# Patient Record
Sex: Female | Born: 1955 | Race: White | Hispanic: No | Marital: Married
Health system: Southern US, Community
[De-identification: ages and names within clinical notes are randomized; demographics above are authoritative.]

## PROBLEM LIST (undated history)

## (undated) DIAGNOSIS — G2581 Restless legs syndrome: Secondary | ICD-10-CM

## (undated) DIAGNOSIS — C801 Malignant (primary) neoplasm, unspecified: Secondary | ICD-10-CM

## (undated) DIAGNOSIS — F329 Major depressive disorder, single episode, unspecified: Secondary | ICD-10-CM

## (undated) DIAGNOSIS — Z9221 Personal history of antineoplastic chemotherapy: Secondary | ICD-10-CM

## (undated) DIAGNOSIS — Z923 Personal history of irradiation: Secondary | ICD-10-CM

## (undated) DIAGNOSIS — C8800 Waldenstrom macroglobulinemia not having achieved remission: Secondary | ICD-10-CM

## (undated) DIAGNOSIS — F32A Depression, unspecified: Secondary | ICD-10-CM

## (undated) DIAGNOSIS — E039 Hypothyroidism, unspecified: Secondary | ICD-10-CM

## (undated) DIAGNOSIS — E785 Hyperlipidemia, unspecified: Secondary | ICD-10-CM

## (undated) DIAGNOSIS — C73 Malignant neoplasm of thyroid gland: Secondary | ICD-10-CM

## (undated) HISTORY — PX: TONSILLECTOMY: SUR1361

## (undated) HISTORY — PX: CHOLECYSTECTOMY: SHX55

## (undated) HISTORY — PX: THYROIDECTOMY: SHX17

---

## 1997-06-02 DIAGNOSIS — C801 Malignant (primary) neoplasm, unspecified: Secondary | ICD-10-CM

## 1997-06-02 HISTORY — DX: Malignant (primary) neoplasm, unspecified: C80.1

## 2015-09-24 ENCOUNTER — Other Ambulatory Visit: Payer: Self-pay | Admitting: Family Medicine

## 2015-09-24 DIAGNOSIS — Z1231 Encounter for screening mammogram for malignant neoplasm of breast: Secondary | ICD-10-CM

## 2015-10-04 ENCOUNTER — Ambulatory Visit
Admission: RE | Admit: 2015-10-04 | Discharge: 2015-10-04 | Disposition: A | Discharge status: Home / Self Care | Payer: BLUE CROSS/BLUE SHIELD | Source: Ambulatory Visit | Attending: Family Medicine | Admitting: Family Medicine

## 2015-10-04 DIAGNOSIS — Z1231 Encounter for screening mammogram for malignant neoplasm of breast: Secondary | ICD-10-CM | POA: Insufficient documentation

## 2015-10-04 HISTORY — DX: Waldenstrom macroglobulinemia not having achieved remission: C88.00

## 2015-10-04 HISTORY — DX: Waldenstrom macroglobulinemia: C88.0

## 2015-10-04 HISTORY — DX: Malignant (primary) neoplasm, unspecified: C80.1

## 2016-08-12 ENCOUNTER — Other Ambulatory Visit: Payer: Self-pay | Admitting: Obstetrics and Gynecology

## 2016-08-12 DIAGNOSIS — Z1239 Encounter for other screening for malignant neoplasm of breast: Secondary | ICD-10-CM

## 2016-10-08 ENCOUNTER — Ambulatory Visit: Payer: BLUE CROSS/BLUE SHIELD

## 2017-05-05 ENCOUNTER — Ambulatory Visit
Admission: RE | Admit: 2017-05-05 | Discharge: 2017-05-05 | Disposition: A | Discharge status: Home / Self Care | Payer: BLUE CROSS/BLUE SHIELD | Source: Ambulatory Visit | Attending: Obstetrics and Gynecology | Admitting: Obstetrics and Gynecology

## 2017-05-05 ENCOUNTER — Other Ambulatory Visit: Payer: Self-pay | Admitting: Obstetrics and Gynecology

## 2017-05-05 DIAGNOSIS — Z1231 Encounter for screening mammogram for malignant neoplasm of breast: Secondary | ICD-10-CM | POA: Insufficient documentation

## 2017-05-05 DIAGNOSIS — Z1239 Encounter for other screening for malignant neoplasm of breast: Secondary | ICD-10-CM

## 2017-05-05 HISTORY — DX: Personal history of antineoplastic chemotherapy: Z92.21

## 2017-05-05 HISTORY — DX: Personal history of irradiation: Z92.3

## 2017-08-21 ENCOUNTER — Encounter: Payer: Self-pay | Admitting: Student

## 2017-08-24 ENCOUNTER — Ambulatory Visit: Payer: BLUE CROSS/BLUE SHIELD | Admitting: Anesthesiology

## 2017-08-24 ENCOUNTER — Encounter
Admission: RE | Disposition: A | Discharge status: Home / Self Care | Payer: Self-pay | Source: Ambulatory Visit | Attending: Unknown Physician Specialty

## 2017-08-24 ENCOUNTER — Ambulatory Visit
Admission: RE | Admit: 2017-08-24 | Discharge: 2017-08-24 | Disposition: A | Discharge status: Home / Self Care | Payer: BLUE CROSS/BLUE SHIELD | Source: Ambulatory Visit | Attending: Unknown Physician Specialty | Admitting: Unknown Physician Specialty

## 2017-08-24 DIAGNOSIS — K209 Esophagitis, unspecified: Secondary | ICD-10-CM | POA: Diagnosis not present

## 2017-08-24 DIAGNOSIS — F329 Major depressive disorder, single episode, unspecified: Secondary | ICD-10-CM | POA: Diagnosis not present

## 2017-08-24 DIAGNOSIS — K319 Disease of stomach and duodenum, unspecified: Secondary | ICD-10-CM | POA: Diagnosis not present

## 2017-08-24 DIAGNOSIS — K2270 Barrett's esophagus without dysplasia: Secondary | ICD-10-CM | POA: Diagnosis not present

## 2017-08-24 DIAGNOSIS — E890 Postprocedural hypothyroidism: Secondary | ICD-10-CM | POA: Diagnosis not present

## 2017-08-24 DIAGNOSIS — Z79899 Other long term (current) drug therapy: Secondary | ICD-10-CM | POA: Insufficient documentation

## 2017-08-24 DIAGNOSIS — E785 Hyperlipidemia, unspecified: Secondary | ICD-10-CM | POA: Diagnosis not present

## 2017-08-24 DIAGNOSIS — K5730 Diverticulosis of large intestine without perforation or abscess without bleeding: Secondary | ICD-10-CM | POA: Diagnosis not present

## 2017-08-24 DIAGNOSIS — Z79890 Hormone replacement therapy (postmenopausal): Secondary | ICD-10-CM | POA: Diagnosis not present

## 2017-08-24 DIAGNOSIS — Z1211 Encounter for screening for malignant neoplasm of colon: Secondary | ICD-10-CM | POA: Insufficient documentation

## 2017-08-24 DIAGNOSIS — Z9221 Personal history of antineoplastic chemotherapy: Secondary | ICD-10-CM | POA: Insufficient documentation

## 2017-08-24 DIAGNOSIS — K2970 Gastritis, unspecified, without bleeding: Secondary | ICD-10-CM | POA: Insufficient documentation

## 2017-08-24 DIAGNOSIS — K640 First degree hemorrhoids: Secondary | ICD-10-CM | POA: Insufficient documentation

## 2017-08-24 DIAGNOSIS — Z85850 Personal history of malignant neoplasm of thyroid: Secondary | ICD-10-CM | POA: Diagnosis not present

## 2017-08-24 HISTORY — DX: Hypothyroidism, unspecified: E03.9

## 2017-08-24 HISTORY — PX: ESOPHAGOGASTRODUODENOSCOPY (EGD) WITH PROPOFOL: SHX5813

## 2017-08-24 HISTORY — DX: Depression, unspecified: F32.A

## 2017-08-24 HISTORY — DX: Major depressive disorder, single episode, unspecified: F32.9

## 2017-08-24 HISTORY — DX: Malignant neoplasm of thyroid gland: C73

## 2017-08-24 HISTORY — PX: COLONOSCOPY WITH PROPOFOL: SHX5780

## 2017-08-24 HISTORY — DX: Hyperlipidemia, unspecified: E78.5

## 2017-08-24 HISTORY — DX: Restless legs syndrome: G25.81

## 2017-08-24 SURGERY — ESOPHAGOGASTRODUODENOSCOPY (EGD) WITH PROPOFOL
Anesthesia: General

## 2017-08-24 MED ORDER — LIDOCAINE HCL (PF) 2 % IJ SOLN
INTRAMUSCULAR | Status: DC | PRN
  Administered 2017-08-24: 60 mg

## 2017-08-24 MED ORDER — PROPOFOL 500 MG/50ML IV EMUL
INTRAVENOUS | Status: AC
  Filled 2017-08-24: qty 50

## 2017-08-24 MED ORDER — SODIUM CHLORIDE 0.9 % IV SOLN
INTRAVENOUS | Status: DC
  Administered 2017-08-24: 13:00:00 via INTRAVENOUS

## 2017-08-24 MED ORDER — LIDOCAINE HCL (PF) 1 % IJ SOLN
INTRAMUSCULAR | Status: AC
  Administered 2017-08-24: 0.3 mL via INTRADERMAL
  Filled 2017-08-24: qty 2

## 2017-08-24 MED ORDER — PROPOFOL 500 MG/50ML IV EMUL
INTRAVENOUS | Status: DC | PRN
  Administered 2017-08-24: 50 ug/kg/min via INTRAVENOUS

## 2017-08-24 MED ORDER — MIDAZOLAM HCL 2 MG/2ML IJ SOLN
INTRAMUSCULAR | Status: AC
  Filled 2017-08-24: qty 2

## 2017-08-24 MED ORDER — FENTANYL CITRATE (PF) 100 MCG/2ML IJ SOLN
INTRAMUSCULAR | Status: DC | PRN
  Administered 2017-08-24 (×2): 50 ug via INTRAVENOUS

## 2017-08-24 MED ORDER — MIDAZOLAM HCL 5 MG/5ML IJ SOLN
INTRAMUSCULAR | Status: DC | PRN
  Administered 2017-08-24: 2 mg via INTRAVENOUS

## 2017-08-24 MED ORDER — PROPOFOL 10 MG/ML IV BOLUS
INTRAVENOUS | Status: DC | PRN
  Administered 2017-08-24: 10 mg via INTRAVENOUS
  Administered 2017-08-24: 20 mg via INTRAVENOUS

## 2017-08-24 MED ORDER — LIDOCAINE HCL (PF) 2 % IJ SOLN
INTRAMUSCULAR | Status: AC
  Filled 2017-08-24: qty 10

## 2017-08-24 MED ORDER — LIDOCAINE HCL (PF) 1 % IJ SOLN
2.0000 mL | Freq: Once | INTRAMUSCULAR | Status: AC
  Administered 2017-08-24: 0.3 mL via INTRADERMAL

## 2017-08-24 MED ORDER — FENTANYL CITRATE (PF) 100 MCG/2ML IJ SOLN
INTRAMUSCULAR | Status: AC
  Filled 2017-08-24: qty 2

## 2017-08-24 MED ORDER — GLYCOPYRROLATE 0.2 MG/ML IJ SOLN
INTRAMUSCULAR | Status: AC
  Filled 2017-08-24: qty 1

## 2017-08-24 MED ORDER — SODIUM CHLORIDE 0.9 % IV SOLN
INTRAVENOUS | Status: DC
  Administered 2017-08-24: 1000 mL via INTRAVENOUS

## 2017-08-24 NOTE — Anesthesia Preprocedure Evaluation (Signed)
Anesthesia Evaluation  Patient identified by MRN, date of birth, ID band Patient awake    Reviewed: Allergy & Precautions, H&P , NPO status , reviewed documented beta blocker date and time   Airway Mallampati: II  TM Distance: >3 FB     Dental  (+) Teeth Intact   Pulmonary neg pulmonary ROS,    Pulmonary exam normal        Cardiovascular negative cardio ROS Normal cardiovascular exam     Neuro/Psych PSYCHIATRIC DISORDERS Depression Restless leg    GI/Hepatic Neg liver ROS, GERD  Controlled,  Endo/Other  Hypothyroidism   Renal/GU negative Renal ROS  negative genitourinary   Musculoskeletal   Abdominal   Peds  Hematology negative hematology ROS (+)   Anesthesia Other Findings Hx of colonic polyps 06/19/2017 History of colon polyps 06/19/2017 History of Barrett's esophagus 06/19/2017 Pure hypercholesterolemia 07/31/2015 Postoperative hypothyroidism 07/31/2015 RLS (restless legs syndrome) 07/31/2015 Recurrent major depressive disorder, in partial remission 07/31/2015  Reproductive/Obstetrics                             Anesthesia Physical Anesthesia Plan  ASA: II  Anesthesia Plan: General   Post-op Pain Management:    Induction:   PONV Risk Score and Plan: 3 and Propofol infusion and TIVA  Airway Management Planned:   Additional Equipment:   Intra-op Plan:   Post-operative Plan:   Informed Consent: I have reviewed the patients History and Physical, chart, labs and discussed the procedure including the risks, benefits and alternatives for the proposed anesthesia with the patient or authorized representative who has indicated his/her understanding and acceptance.   Dental Advisory Given  Plan Discussed with: CRNA  Anesthesia Plan Comments:         Anesthesia Quick Evaluation

## 2017-08-24 NOTE — Transfer of Care (Signed)
Immediate Anesthesia Transfer of Care Note  Patient: Shelby Stafford  Procedure(s) Performed: ESOPHAGOGASTRODUODENOSCOPY (EGD) WITH PROPOFOL (N/A ) COLONOSCOPY WITH PROPOFOL (N/A )  Patient Location: PACU  Anesthesia Type:General  Level of Consciousness: sedated  Airway & Oxygen Therapy: Patient Spontanous Breathing and Patient connected to nasal cannula oxygen  Post-op Assessment: Report given to RN and Post -op Vital signs reviewed and stable  Post vital signs: Reviewed and stable  Last Vitals:  Vitals Value Taken Time  BP    Temp    Pulse 98 08/24/2017  1:59 PM  Resp 16 08/24/2017  1:59 PM  SpO2 94 % 08/24/2017  1:59 PM  Vitals shown include unvalidated device data.  Last Pain:  Vitals:   08/24/17 1216  TempSrc: Tympanic  PainSc: 0-No pain         Complications: No apparent anesthesia complications

## 2017-08-24 NOTE — Anesthesia Post-op Follow-up Note (Signed)
Anesthesia QCDR form completed.        

## 2017-08-24 NOTE — Op Note (Signed)
[facility] Gastroenterology Patient Name: Shelby Stafford Procedure Date: 08/24/2017 1:23 PM MRN: [id number] Account #: [id number] Date of Birth: 08-01-1955 Admit Type: Ambulatory Age: 62 Room: [facility] ENDO ROOM 3 Gender: Female Note Status: Finalized Procedure:            Upper GI endoscopy Indications:          Follow-up of Barrett's esophagus Providers:            Manya Silvas, MD Referring MD:         Sofie Hartigan (Referring MD) Medicines:            Propofol per Anesthesia Complications:        No immediate complications. Procedure:            Pre-Anesthesia Assessment:                       - After reviewing the risks and benefits, the patient                        was deemed in satisfactory condition to undergo the                        procedure.                       After obtaining informed consent, the endoscope was                        passed under direct vision. Throughout the procedure,                        the patient's blood pressure, pulse, and oxygen                        saturations were monitored continuously. The Endoscope                        was introduced through the mouth, and advanced to the                        second part of duodenum. The upper GI endoscopy was                        accomplished without difficulty. The patient tolerated                        the procedure well. Findings:      There were esophageal mucosal changes secondary to established       short-segment Barrett's disease present in the lower third of the       esophagus. The maximum longitudinal extent of these mucosal changes was       2 cm in length. Mucosa was biopsied with a cold forceps for histology.       One specimen bottle was sent to pathology.      Patchy moderate inflammation characterized by erythema and granularity       was found in the gastric antrum.      The gastric body was normal.      The examined duodenum was  normal. Impression:           - Esophageal mucosal changes  secondary to established                        short-segment Barrett's disease. Biopsied.                       - Gastritis.                       - Normal gastric body.                       - Normal examined duodenum. Recommendation:       - Await pathology results.                       - Perform a colonoscopy as previously scheduled. Manya Silvas, MD 08/24/2017 1:37:52 PM This report has been signed electronically. Number of Addenda: 0 Note Initiated On: 08/24/2017 1:23 PM      [facility]

## 2017-08-24 NOTE — H&P (Signed)
Primary Care Physician:  Sofie Hartigan, MD Primary Gastroenterologist:  Dr. Vira Agar  Pre-Procedure History & Physical: HPI:  Shelby Stafford is a 62 y.o. female is here for an endoscopy and colonoscopy.  This is for [facility] colon polyps and Barretts esophagus/   Past Medical History:  Diagnosis Date  . Cancer ([place]) 1999   THYROID CA  . Depression   . Hyperlipidemia   . Hypothyroidism   . Personal history of chemotherapy    2 rounds  . Personal history of radiation therapy    2 rounds of I131 for thyroid  . Restless leg syndrome   . Thyroid cancer ([place])   . 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 CESAREAN SECTION    . CHOLECYSTECTOMY    . THYROIDECTOMY    . TONSILLECTOMY      Prior to Admission medications   Medication Sig Start Date End Date Taking? Authorizing Provider  atorvastatin (LIPITOR) 10 MG tablet Take 10 mg by mouth daily.   Yes [provider]  Wallace Cullens POWD by Does not apply route.   Yes [provider]  gabapentin (NEURONTIN) 300 MG capsule Take 300 mg by mouth 3 (three) times daily.   Yes [provider]  levothyroxine (SYNTHROID, LEVOTHROID) 88 MCG tablet Take 88 mcg by mouth daily before breakfast.   Yes [provider]  Multiple Vitamins-Minerals (MULTIVITAMIN WITH MINERALS) tablet Take 1 tablet by mouth daily.   Yes [provider]  ondansetron (ZOFRAN-ODT) 4 MG disintegrating tablet Take 4 mg by mouth every 8 (eight) hours as needed for nausea or vomiting.   Yes [provider]  traZODone (DESYREL) 50 MG tablet Take 50 mg by mouth at bedtime.   Yes [provider]  venlafaxine XR (EFFEXOR XR) 150 MG 24 hr capsule Take 150 mg by mouth daily with breakfast.   Yes [provider]    Allergies as of 07/06/2017  . (Not on File)    Family History  Problem Relation Age of Onset  . Breast cancer Neg Hx     Social History    Socioeconomic History  . Marital status: Married    Spouse name: Not on file  . Number of children: Not on file  . Years of education: Not on file  . Highest education level: Not on file  Occupational History  . Not on file  Social Needs  . Financial resource strain: Not on file  . Food insecurity:    Worry: Not on file    Inability: Not on file  . Transportation needs:    Medical: Not on file    Non-medical: Not on file  Tobacco Use  . Smoking status: Not on file  Substance and Sexual Activity  . Alcohol use: Not on file  . Drug use: Not on file  . Sexual activity: Not on file  Lifestyle  . Physical activity:    Days per week: Not on file    Minutes per session: Not on file  . Stress: Not on file  Relationships  . Social connections:    Talks on phone: Not on file    Gets together: Not on file    Attends religious service: Not on file    Active member of club or organization: Not on file    Attends meetings of clubs or organizations: Not on file    Relationship status: Not on file  . Intimate partner violence:  Fear of current or ex partner: Not on file    Emotionally abused: Not on file    Physically abused: Not on file    Forced sexual activity: Not on file  Other Topics Concern  . Not on file  Social History Narrative  . Not on file    Review of Systems: See HPI, otherwise negative ROS  Physical Exam: BP 113/70   Pulse (!) 114   Temp (!) 96.5 F (35.8 C) (Tympanic)   Resp 17   Ht 5\' 4"  (1.626 m)   Wt 62.1 kg (137 lb)   SpO2 99%   BMI 23.52 kg/m  General:   Alert,  pleasant and cooperative in NAD Head:  Normocephalic and atraumatic. Neck:  Supple; no masses or thyromegaly. Lungs:  Clear throughout to auscultation.    Heart:  Regular rate and rhythm. Abdomen:  Soft, nontender and nondistended. Normal bowel sounds, without guarding, and without rebound.   Neurologic:  Alert and  oriented x4;  grossly normal  neurologically.  Impression/Plan: Shelby Stafford is here for an endoscopy and colonoscopy to be performed for [facility] colon polyps and Barretts esophagus  Risks, benefits, limitations, and alternatives regarding  endoscopy and colonoscopy have been reviewed with the patient.  Questions have been answered.  All parties agreeable.   Gaylyn Cheers, MD  08/24/2017, 1:21 PM

## 2017-08-24 NOTE — Op Note (Signed)
[facility] Gastroenterology Patient Name: Shelby Stafford Procedure Date: 08/24/2017 1:22 PM MRN: [id number] Account #: [id number] Date of Birth: 08/26/1955 Admit Type: Outpatient Age: 62 Room: [facility] ENDO ROOM 3 Gender: Female Note Status: Finalized Procedure:            Colonoscopy Indications:          High risk colon cancer surveillance: Personal history                        of colonic polyps Providers:            Manya Silvas, MD Referring MD:         Sofie Hartigan (Referring MD) Medicines:            Propofol per Anesthesia Complications:        No immediate complications. Procedure:            Pre-Anesthesia Assessment:                       - After reviewing the risks and benefits, the patient                        was deemed in satisfactory condition to undergo the                        procedure.                       After obtaining informed consent, the colonoscope was                        passed under direct vision. Throughout the procedure,                        the patient's blood pressure, pulse, and oxygen                        saturations were monitored continuously. The                        Colonoscope was introduced through the anus and                        advanced to the the cecum, identified by appendiceal                        orifice and ileocecal valve. The colonoscopy was                        performed without difficulty. The patient tolerated the                        procedure well. The quality of the bowel preparation                        was excellent. Findings:      A few small-mouthed diverticula were found in the sigmoid colon.      Internal hemorrhoids were found during endoscopy. The hemorrhoids were       small, medium-sized and Grade I (internal hemorrhoids that do not       prolapse).  The exam was otherwise without abnormality. Impression:           - Diverticulosis in the sigmoid colon.                    - Internal hemorrhoids.                       - No specimens collected. Recommendation:       - Repeat colonoscopy in 5 years for adenoma                        surveillance. Manya Silvas, MD 08/24/2017 1:58:15 PM This report has been signed electronically. Number of Addenda: 0 Note Initiated On: 08/24/2017 1:22 PM Scope Withdrawal Time: 0 hours 6 minutes 50 seconds  Total Procedure Duration: 0 hours 15 minutes 8 seconds       [facility]

## 2017-08-25 ENCOUNTER — Encounter: Payer: Self-pay | Admitting: Unknown Physician Specialty

## 2017-08-25 NOTE — Anesthesia Postprocedure Evaluation (Signed)
Anesthesia Post Note  Patient: Jacquel Redditt  Procedure(s) Performed: ESOPHAGOGASTRODUODENOSCOPY (EGD) WITH PROPOFOL (N/A ) COLONOSCOPY WITH PROPOFOL (N/A )  Patient location during evaluation: Endoscopy Anesthesia Type: General Level of consciousness: awake and alert Pain management: pain level controlled Vital Signs Assessment: post-procedure vital signs reviewed and stable Respiratory status: spontaneous breathing, nonlabored ventilation and respiratory function stable Cardiovascular status: blood pressure returned to baseline and stable Postop Assessment: no apparent nausea or vomiting Anesthetic complications: no     Last Vitals:  Vitals:   08/24/17 1419 08/24/17 1429  BP: 104/79 104/78  Pulse: 84 74  Resp: 16 18  Temp:    SpO2: 94% 96%    Last Pain:  Vitals:   08/24/17 1429  TempSrc:   PainSc: 0-No pain                 Alphonsus Sias

## 2017-08-26 LAB — SURGICAL PATHOLOGY

## 2018-06-16 ENCOUNTER — Other Ambulatory Visit: Payer: Self-pay | Admitting: Family Medicine

## 2018-06-16 DIAGNOSIS — Z1231 Encounter for screening mammogram for malignant neoplasm of breast: Secondary | ICD-10-CM

## 2018-06-30 ENCOUNTER — Ambulatory Visit: Payer: BLUE CROSS/BLUE SHIELD

## 2018-10-06 DIAGNOSIS — E890 Postprocedural hypothyroidism: Secondary | ICD-10-CM | POA: Diagnosis not present

## 2018-10-20 DIAGNOSIS — E890 Postprocedural hypothyroidism: Secondary | ICD-10-CM | POA: Diagnosis not present

## 2018-10-20 DIAGNOSIS — E7800 Pure hypercholesterolemia, unspecified: Secondary | ICD-10-CM | POA: Diagnosis not present

## 2018-10-20 DIAGNOSIS — E119 Type 2 diabetes mellitus without complications: Secondary | ICD-10-CM | POA: Diagnosis not present

## 2019-02-16 IMAGING — MG MM DIGITAL SCREENING BILAT W/ TOMO W/ CAD
8 of 12 series · 8 of 28 positions shown · non-contrast
Comparison: Previous exam(s).

CLINICAL DATA: Screening.

EXAM:
2D DIGITAL SCREENING BILATERAL MAMMOGRAM WITH CAD AND ADJUNCT TOMO

[R CC synth-2D]
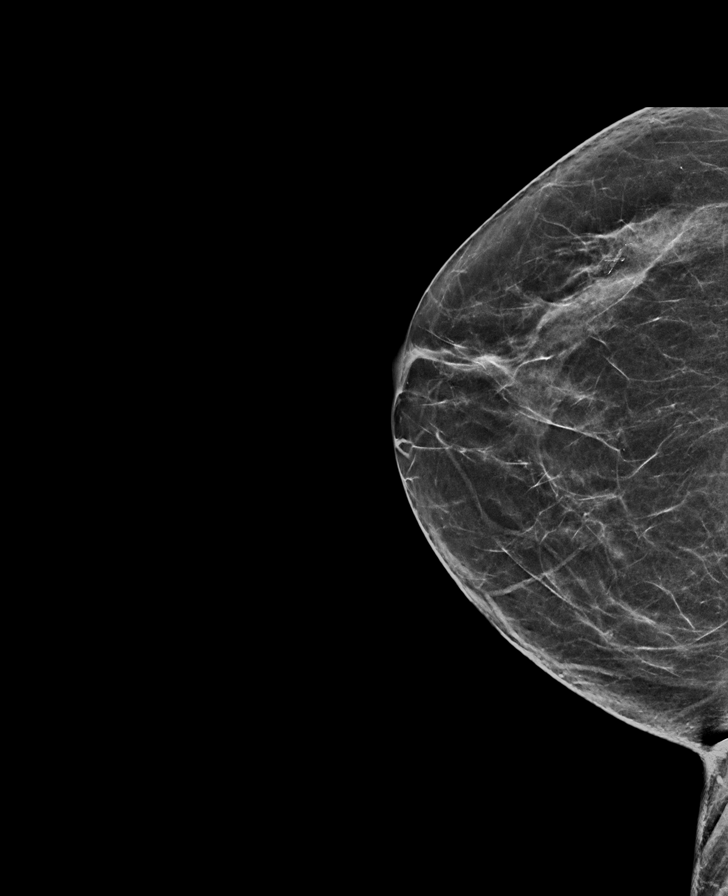

[R CC]
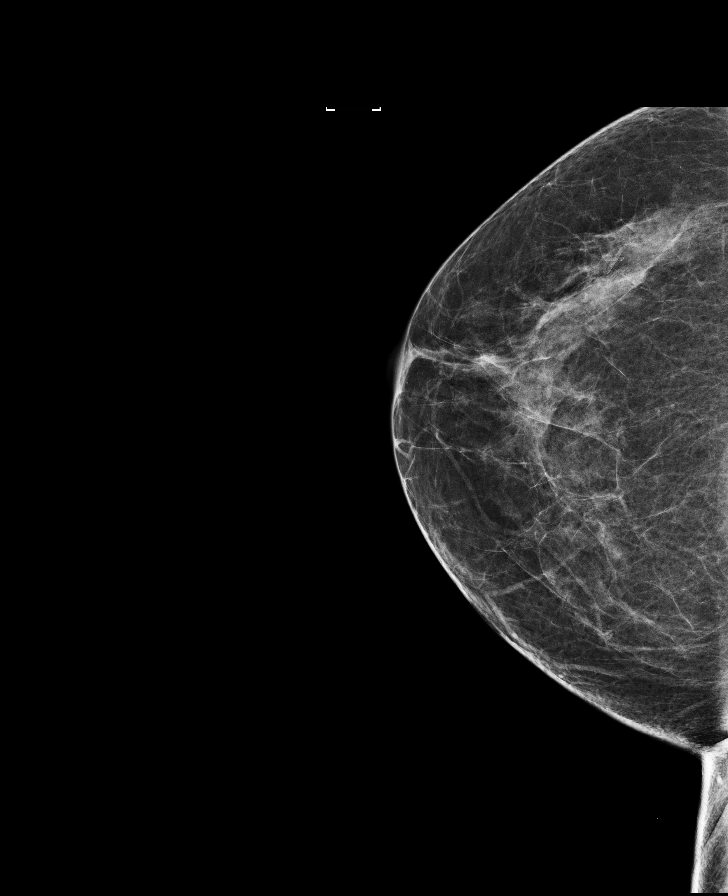

[R MLO]
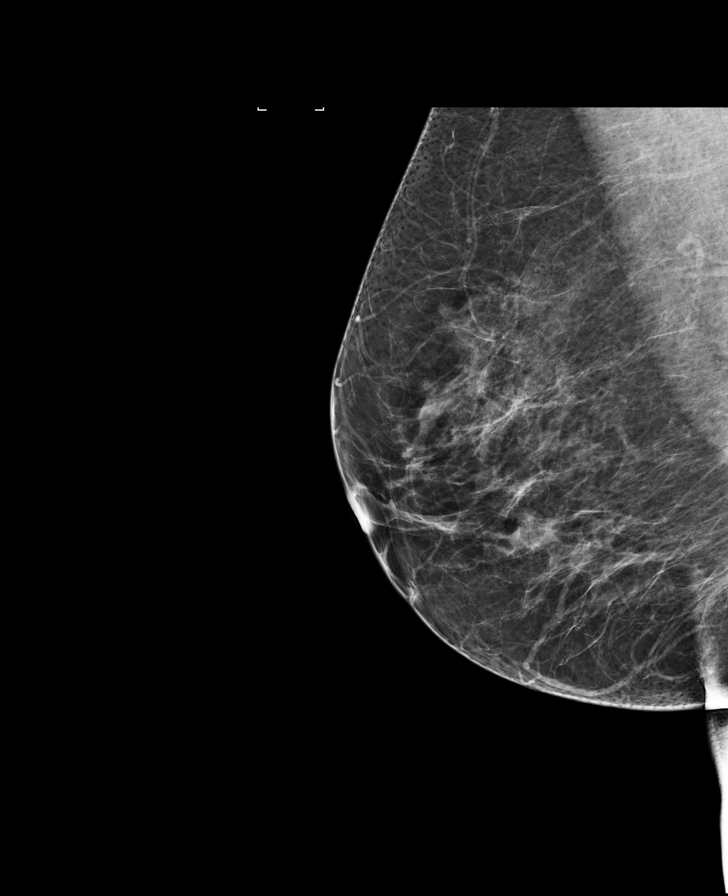

[L CC]
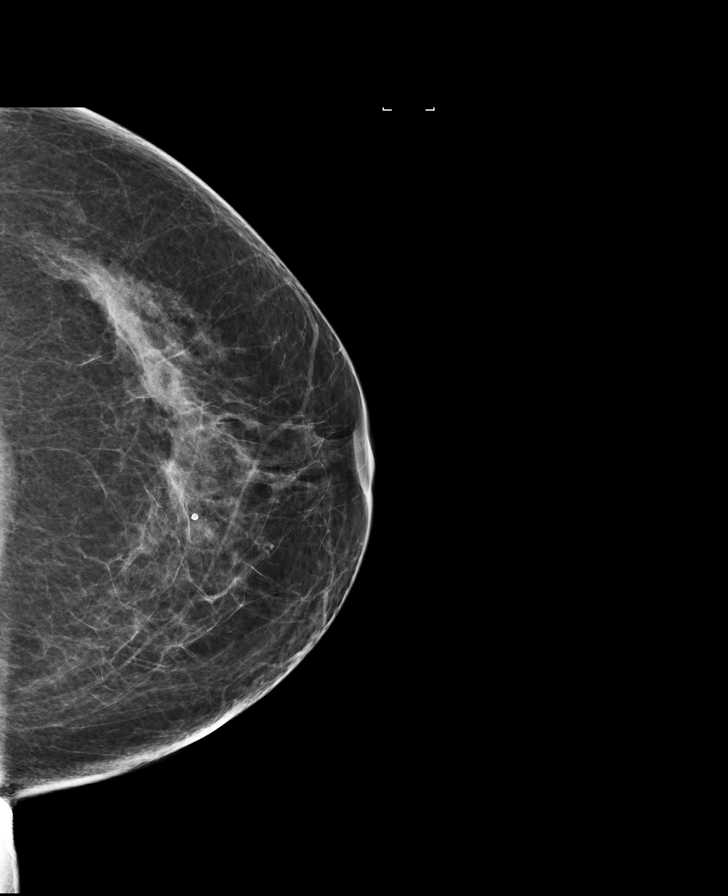

[R MLO synth-2D]
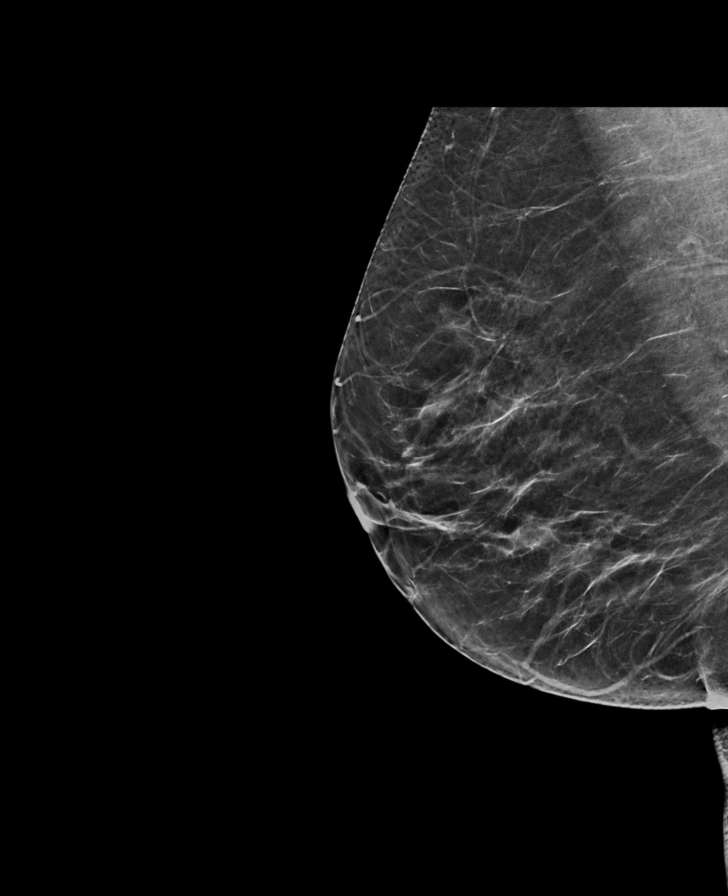

[L CC synth-2D]
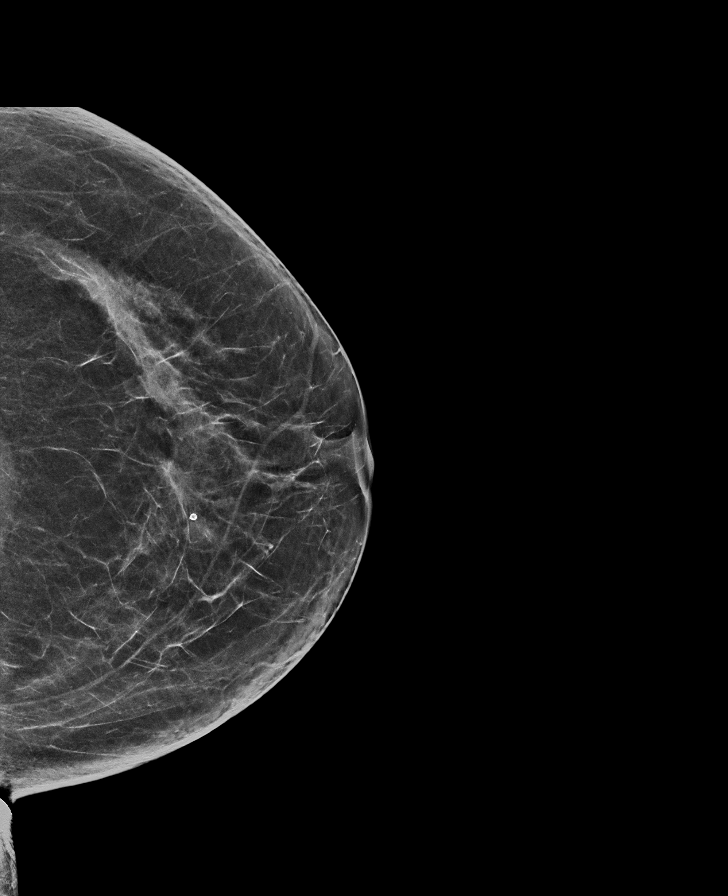

[L MLO synth-2D]
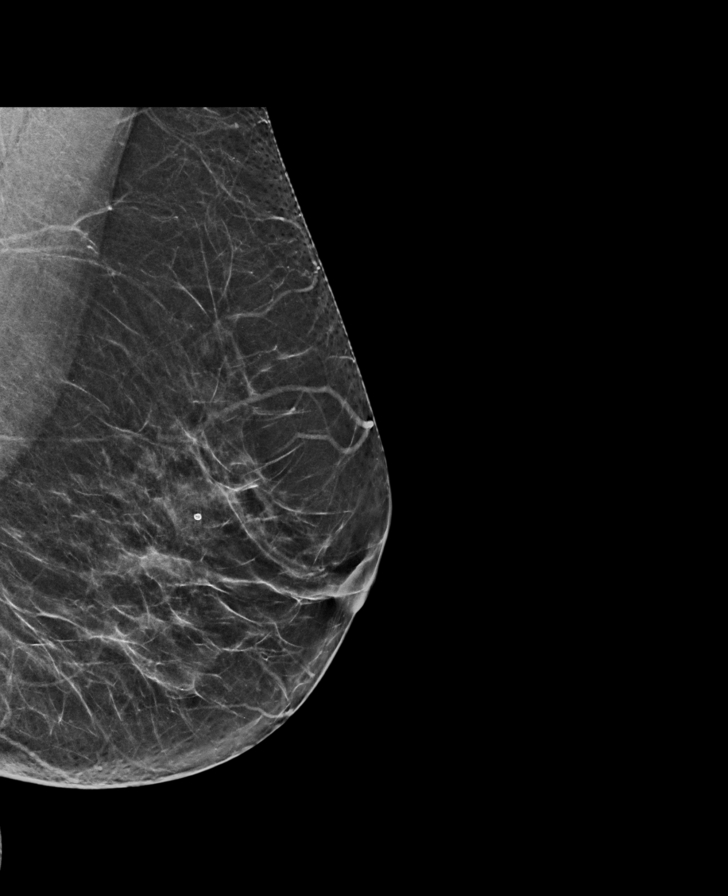

[L MLO]
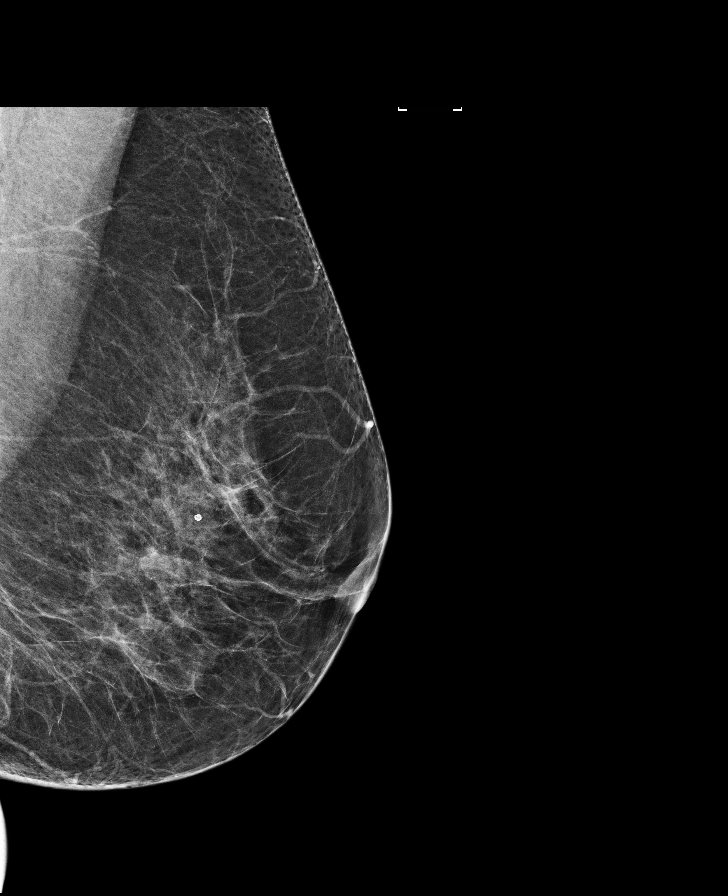

[8 of 28 positions shown; findings below may reference images not displayed]

ACR Breast Density Category b: There are scattered areas of
fibroglandular density.
FINDINGS: There are no findings suspicious for malignancy. Images were
processed with CAD.
IMPRESSION: No mammographic evidence of malignancy. A result letter of this
screening mammogram will be mailed directly to the patient.

RECOMMENDATION:
Screening mammogram in one year. (Code:97-6-RS4)

BI-RADS CATEGORY  1: Negative.
